# Patient Record
Sex: Male | Born: 1980 | Race: White | Hispanic: No | Marital: Married | Smoking: Former smoker
Health system: Southern US, Community
[De-identification: ages and names within clinical notes are randomized; demographics above are authoritative.]

## PROBLEM LIST (undated history)

## (undated) DIAGNOSIS — L299 Pruritus, unspecified: Secondary | ICD-10-CM

## (undated) DIAGNOSIS — N200 Calculus of kidney: Secondary | ICD-10-CM

## (undated) HISTORY — DX: Calculus of kidney: N20.0

## (undated) HISTORY — DX: Pruritus, unspecified: L29.9

---

## 2004-11-23 ENCOUNTER — Emergency Department (HOSPITAL_COMMUNITY): Admission: EM | Admit: 2004-11-23 | Discharge: 2004-11-23 | Payer: Self-pay | Admitting: Emergency Medicine

## 2005-12-13 ENCOUNTER — Ambulatory Visit (HOSPITAL_COMMUNITY): Admission: RE | Admit: 2005-12-13 | Discharge: 2005-12-13 | Payer: Self-pay | Admitting: Urology

## 2007-09-28 IMAGING — CT CT PELVIS WO/W CM
2 of 4 series · 12 of 32 positions shown, 18 images · IV contrast (omnipaque)
Comparison: None.

CLINICAL DATA: 25-year-old male with frequency of urination for two months.  
ABDOMEN CT WITHOUT AND WITH CONTRAST:
TECHNIQUE: Multidetector CT imaging of the abdomen was performed both before and during bolus administration of intravenous contrast.
Contrast:  733cc Omnipaque 300.
TECHNIQUE: Multidetector CT imaging of the pelvis was performed both before and during bolus administration of intravenous contrast.

[Series 1370: — · axial · 0.77mm/px · z∈[+1270,+1656]mm · 10 of 95 slices shown, 16 images (1 of 2)]
[im 9/95  soft-tissue]
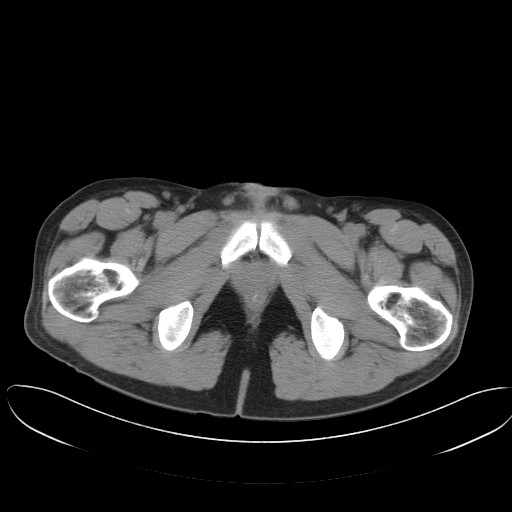
[im 9/95  bone]
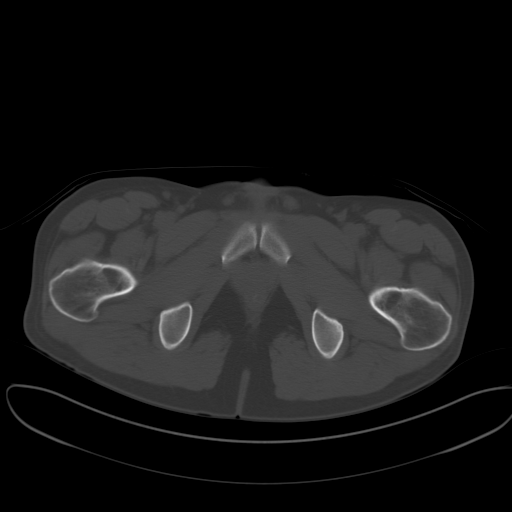
[im 18/95  soft-tissue]
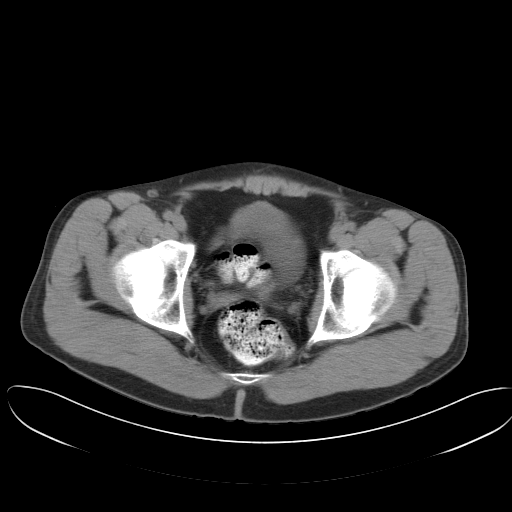
[im 26/95  soft-tissue]
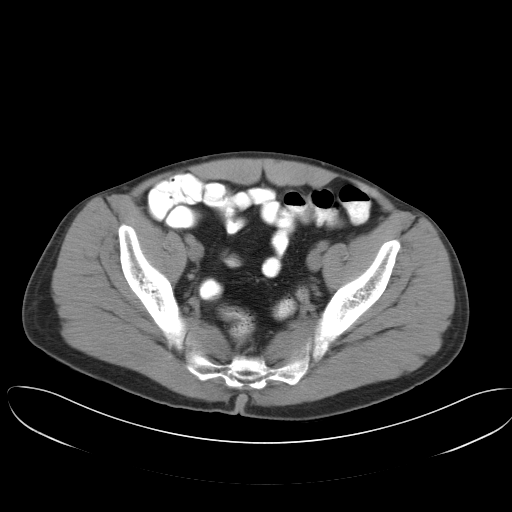
[im 35/95  soft-tissue]
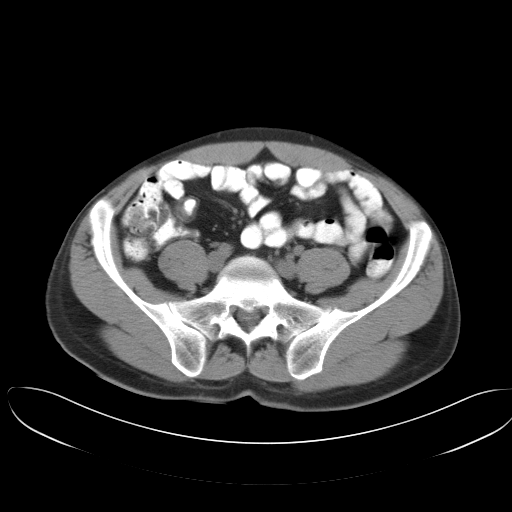
[im 43/95  soft-tissue]
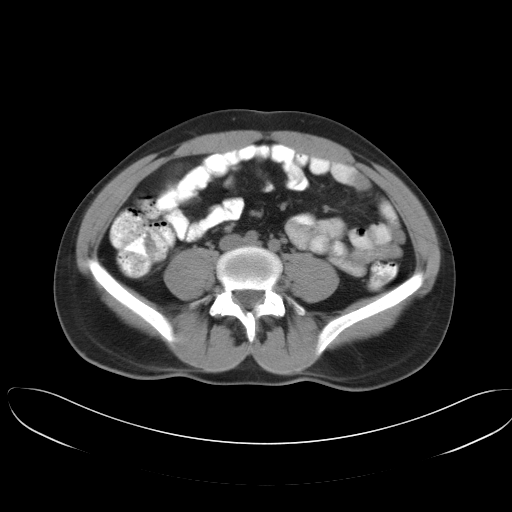
[im 52/95  soft-tissue]
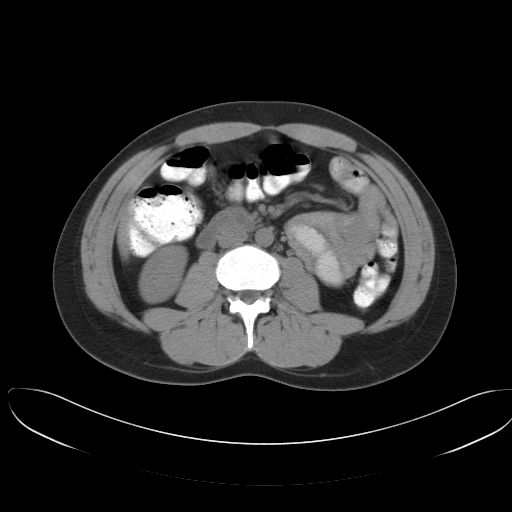
[im 60/95  soft-tissue]
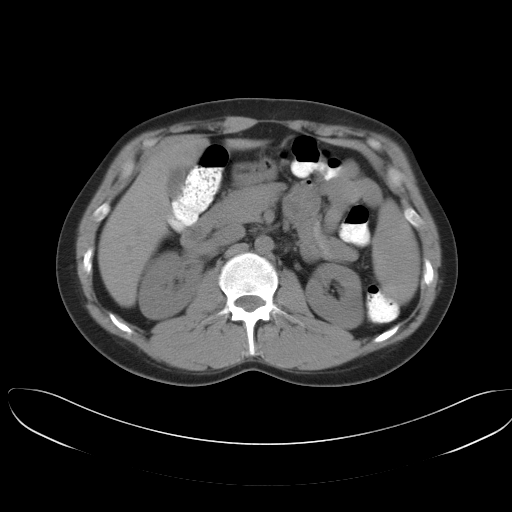
[im 60/95  lung]
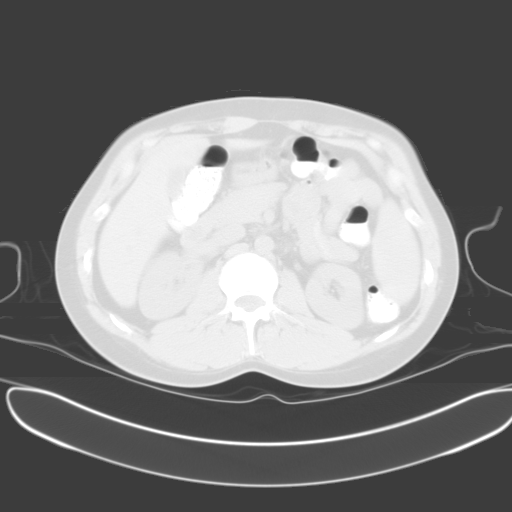
[im 69/95  soft-tissue]
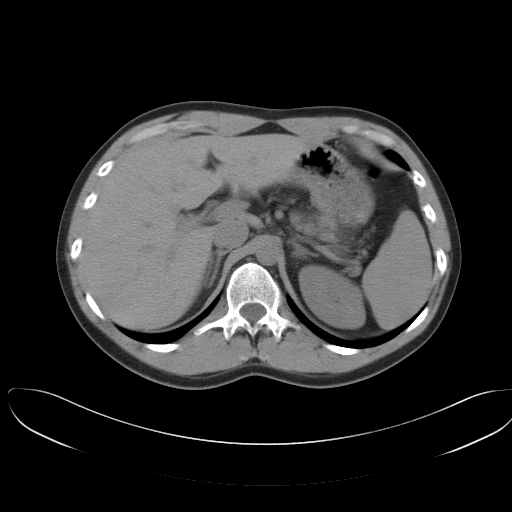
[im 69/95  lung]
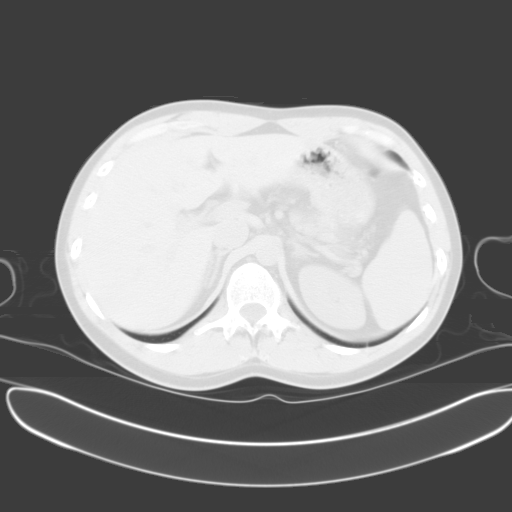
[im 77/95  soft-tissue]
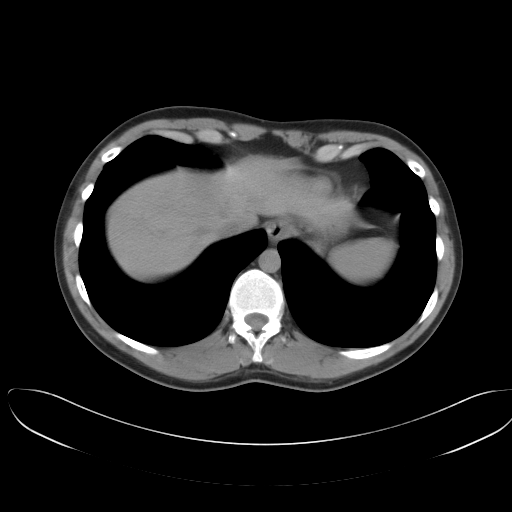
[im 77/95  lung]
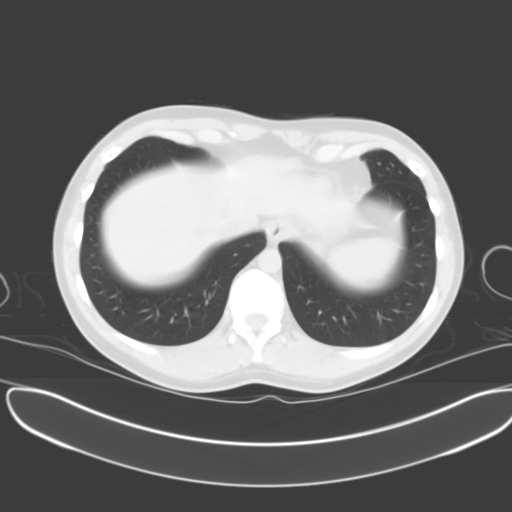
[im 77/95  bone]
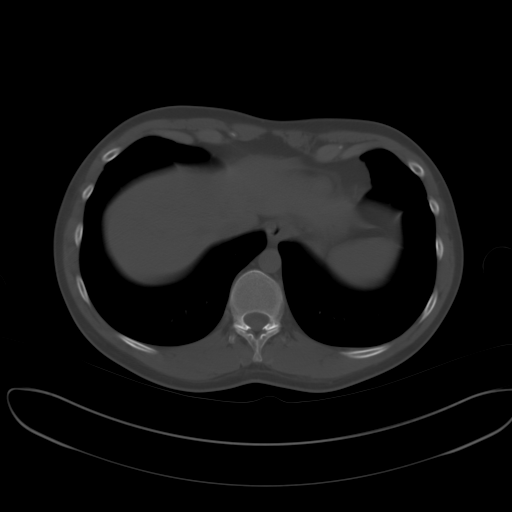
[im 86/95  soft-tissue]
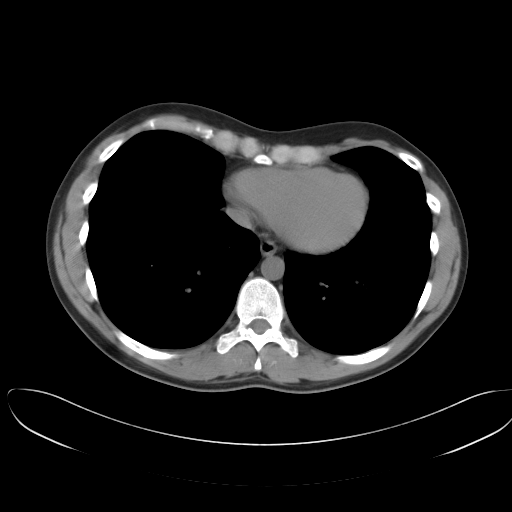
[im 86/95  lung]
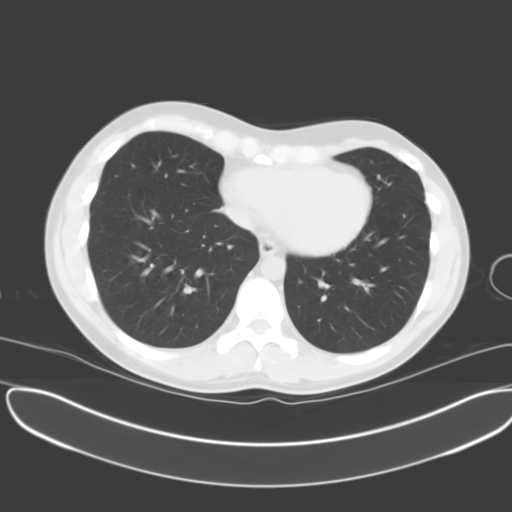

[Series 1371: — · axial · 0.77mm/px · z∈[+1586,+1640]mm · 2 of 35 slices shown (2 of 2)]
[im 12/35  soft-tissue]
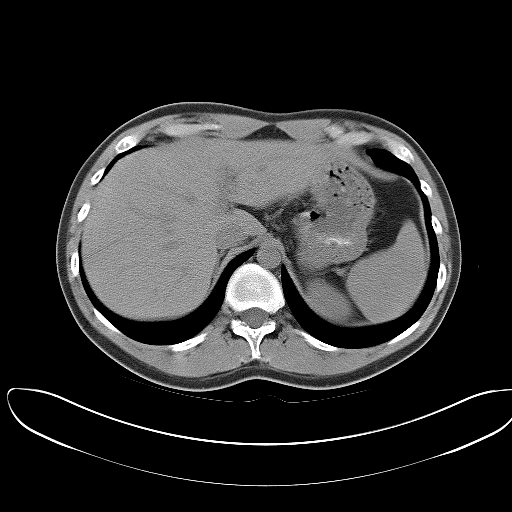
[im 23/35  soft-tissue]
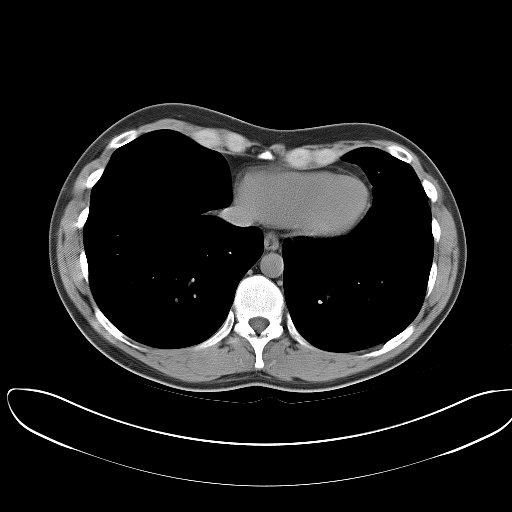

[12 of 32 positions shown; findings below may reference images not displayed]

FINDINGS: The lung bases are clear.  No evidence for free air.  Negative for kidney stones or hydronephrosis.  Mild wall thickening involving the terminal ileum.  This is a nonspecific finding and there is no significant inflammation in the adjacent fat.  The appendix has a normal appearance.  The liver, portal vein and gallbladder are within normal limits.  Normal appearance of both adrenal glands and kidneys.  The spleen, pancreas and stomach are within normal limits.  Negative for free fluid or lymphadenopathy.  A few prominent nodes in the ileocecal mesentery are nonspecific.  No acute bone abnormalities.
IMPRESSION: 1. No acute findings in the abdomen. 
2. Mild wall thickening involving the terminal ileum.  This is a nonspecific finding and could be related to under-distention.  

PELVIS CT WITH AND WITHOUT CONTRAST:
FINDINGS: Prostate, seminal vesicles and urinary bladder are within normal limits.  Moderate amount of stool in the colon.  No acute bone abnormalities.
IMPRESSION: Negative pelvic CT.

## 2008-04-05 ENCOUNTER — Ambulatory Visit (HOSPITAL_COMMUNITY): Admission: RE | Admit: 2008-04-05 | Discharge: 2008-04-05 | Payer: Self-pay | Admitting: Family Medicine

## 2008-04-19 ENCOUNTER — Inpatient Hospital Stay (HOSPITAL_COMMUNITY): Admission: EM | Admit: 2008-04-19 | Discharge: 2008-04-21 | Payer: Self-pay | Admitting: Emergency Medicine

## 2009-08-16 ENCOUNTER — Ambulatory Visit (HOSPITAL_COMMUNITY): Admission: RE | Admit: 2009-08-16 | Discharge: 2009-08-16 | Payer: Self-pay | Admitting: Family Medicine

## 2010-07-31 LAB — CBC
Hemoglobin: 12.5 g/dL — ABNORMAL LOW (ref 13.0–17.0)
Hemoglobin: 14.7 g/dL (ref 13.0–17.0)
MCHC: 33.9 g/dL (ref 30.0–36.0)
MCHC: 34 g/dL (ref 30.0–36.0)
MCV: 85.8 fL (ref 78.0–100.0)
MCV: 86.7 fL (ref 78.0–100.0)
RDW: 12.3 % (ref 11.5–15.5)
WBC: 18.9 10*3/uL — ABNORMAL HIGH (ref 4.0–10.5)

## 2010-07-31 LAB — DIFFERENTIAL
Basophils Absolute: 0 10*3/uL (ref 0.0–0.1)
Basophils Relative: 0 % (ref 0–1)
Eosinophils Absolute: 0 10*3/uL (ref 0.0–0.7)
Eosinophils Relative: 0 % (ref 0–5)
Monocytes Relative: 5 % (ref 3–12)
Neutro Abs: 17.1 10*3/uL — ABNORMAL HIGH (ref 1.7–7.7)
Neutrophils Relative %: 89 % — ABNORMAL HIGH (ref 43–77)
Neutrophils Relative %: 90 % — ABNORMAL HIGH (ref 43–77)

## 2010-07-31 LAB — CULTURE, ROUTINE-ABSCESS

## 2010-07-31 LAB — BASIC METABOLIC PANEL
GFR calc Af Amer: >60 mL/min (ref >60)
GFR calc non Af Amer: >60 mL/min (ref >60)
Glucose, Bld: 110 mg/dL — ABNORMAL HIGH (ref 70–99)
Potassium: 3.4 mEq/L — ABNORMAL LOW (ref 3.5–5.1)
Sodium: 136 mEq/L (ref 135–145)

## 2010-07-31 LAB — ANAEROBIC CULTURE

## 2010-08-29 NOTE — Discharge Summary (Signed)
NAME:  Dennis Weber, Dennis Weber NO.:  [id number]   MEDICAL RECORD NO.:  [id number]          PATIENT TYPE:  INP   LOCATION:  A227                          FACILITY:  [facility]   PHYSICIAN:  Dalia Heading, M.D.  DATE OF BIRTH:  [birth year]   DATE OF ADMISSION:  04/19/2008  DATE OF DISCHARGE:  01/06/2010LH                               DISCHARGE SUMMARY   HOSPITAL COURSE SUMMARY:  The patient is a 30 year old white male who  suffers from Crohn disease who presented to the emergency room with a  perirectal abscess.  He was admitted to the hospital for intravenous  antibiotic therapy and pain control.  Subsequently, he was taken to the  operating room on April 20, 2008, underwent incision and drainage of  the perirectal abscess.  He tolerated the procedure well.  His  postoperative course was unremarkable.  His diet was advanced without  difficulty.   The patient is being discharged home on April 21, 2008, in good  improving condition.   DISCHARGE INSTRUCTIONS:  The patient is to follow up with Dr. Franky Macho on April 27, 2008.  He is to do sitz baths twice a day.   DISCHARGE MEDICATIONS:  Asacol 400 mg p.o. 6 times a day, Prilosec 20 mg  p.o. daily, ciprofloxacin 500 mg p.o. b.i.d. x10 days, Flagyl 500 mg  p.o. t.i.d. x10 days, Percocet 1-2 tablets p.o. q.4 h p.r.n. pain.   PRINCIPAL DIAGNOSES:  1. Perirectal abscess.  2. History of Crohn disease.   PRINCIPAL PROCEDURE:  Incision and drainage of perirectal abscess on  April 20, 2008.      Dalia Heading, M.D.  Electronically Signed     MAJ/MEDQ  D:  04/21/2008  T:  04/21/2008  Job:  [id number]   cc:   Patrica Duel, M.D.  Fax: [phone]

## 2010-08-29 NOTE — H&P (Signed)
NAME:  Dennis Weber, Dennis Weber NO.:  [id number]   MEDICAL RECORD NO.:  [id number]          PATIENT TYPE:  EMS   LOCATION:  ED                            FACILITY:  [facility]   PHYSICIAN:  Dalia Heading, M.D.  DATE OF BIRTH:  06/25/1980   DATE OF ADMISSION:  04/19/2008  DATE OF DISCHARGE:  LH                              HISTORY & PHYSICAL   CHIEF COMPLAINT:  Perirectal abscess.   HISTORY OF PRESENT ILLNESS:  The patient is a 30 year old white male who  presents with a 1-week history of worsening perirectal pain.  He started  having fevers and chills over the last 24 hours.  He presented to the  emergency room for further evaluation and treatment.  He was noted to  have a perirectal abscess and a surgery consultation was obtained.   PAST MEDICAL HISTORY:  Crohn's disease.   PAST SURGICAL HISTORY:  Unremarkable.   CURRENT MEDICATIONS:  Asacol, ibuprofen.   ALLERGIES:  NO KNOWN DRUG ALLERGIES.   REVIEW OF SYSTEMS:  Noncontributory.   PHYSICAL EXAMINATION:  GENERAL:  The patient is a well-developed, well-  nourished white male in no acute distress.  VITAL SIGNS:  He is afebrile and vital signs are stable.  LUNGS:  Clear to auscultation with equal breath sounds bilaterally.  HEART:  Reveals a regular rate and rhythm without S3, S4 or murmurs.  ABDOMEN:  Benign.  RECTAL:  Reveals a perirectal abscess which is not draining along the  posterior aspect of the anus.  Examination was limited secondary to  pain.   MET-7 and CBC are pending.   IMPRESSION:  1. Perirectal abscess.  2. History of Crohn's disease.   PLAN:  The patient will be admitted to hospital for pain control and IV  antibiotics.  He subsequently will undergo incision and drainage of a  perirectal abscess in the operating room.  The risks and benefits of the  procedure, including bleeding, infection, and recurrence of the abscess  were fully explained to the patient who gave informed consent.      Dalia Heading, M.D.  Electronically Signed     MAJ/MEDQ  D:  04/19/2008  T:  04/19/2008  Job:  [id number]   cc:   Patrica Duel, M.D.  Fax: [phone]

## 2010-08-29 NOTE — Op Note (Signed)
NAME:  Dennis Weber, Dennis Weber NO.:  [id number]   MEDICAL RECORD NO.:  [id number]          PATIENT TYPE:  INP   LOCATION:  A227                          FACILITY:  [facility]   PHYSICIAN:  Dalia Heading, M.D.  DATE OF BIRTH:  Apr 17, 1980   DATE OF PROCEDURE:  04/20/2008  DATE OF DISCHARGE:                               OPERATIVE REPORT   PREOPERATIVE DIAGNOSIS:  Perirectal abscess.   POSTOPERATIVE DIAGNOSIS:  Perirectal abscess.   PROCEDURE:  Incision and drainage of perirectal abscess.   SURGEON:  Dalia Heading, MD   ANESTHESIA:  General.   INDICATIONS:  The patient is a 30 year old white male who presents with  a perirectal abscess.  The risks and benefits of the procedure including  bleeding, infection, and the possible recurrence of the abscess were  fully explained to the patient, gave informed consent.   PROCEDURE NOTE:  The patient was placed in the lithotomy position after  general anesthesia was administered.  The perineum was prepped and  draped using the usual sterile technique with Betadine.  Surgical site  confirmation was performed.   On rectal examination, the abscess cavity was noted at the 5 o'clock  position.  An incision was made just outside the anal verge.  Purulent  fluid was found.  This did track up submucosally along the posterior  aspect of the anus.  The wound was irrigated with normal saline.  The  fluid was cultured for both aerobic and anaerobic bacteria.  This was  sent to Microbiology for further examination.  Once irrigation was  performed, the wound was packed with iodoform new gauze.  Sensorcaine  0.5% was instilled in the surrounding wound.   All tape and needle counts were correct at the end of the procedure.  The patient was awakened and transferred to PACU in stable condition.   COMPLICATIONS:  None.   SPECIMEN:  Aerobic and anaerobic cultures, perirectal abscess.   BLOOD LOSS:  Minimal.      Dalia Heading, M.D.  Electronically Signed     MAJ/MEDQ  D:  04/20/2008  T:  04/21/2008  Job:  [id number]   cc:   Patrica Duel, M.D.  Fax: [phone]

## 2011-06-01 IMAGING — CR DG CHEST 2V
2 series · 2 of 2 positions shown · non-contrast
Comparison: PA and lateral chest 04/05/2008.

CLINICAL DATA: Cough.

CHEST - 2 VIEW

[view not recorded (1 of 2)]
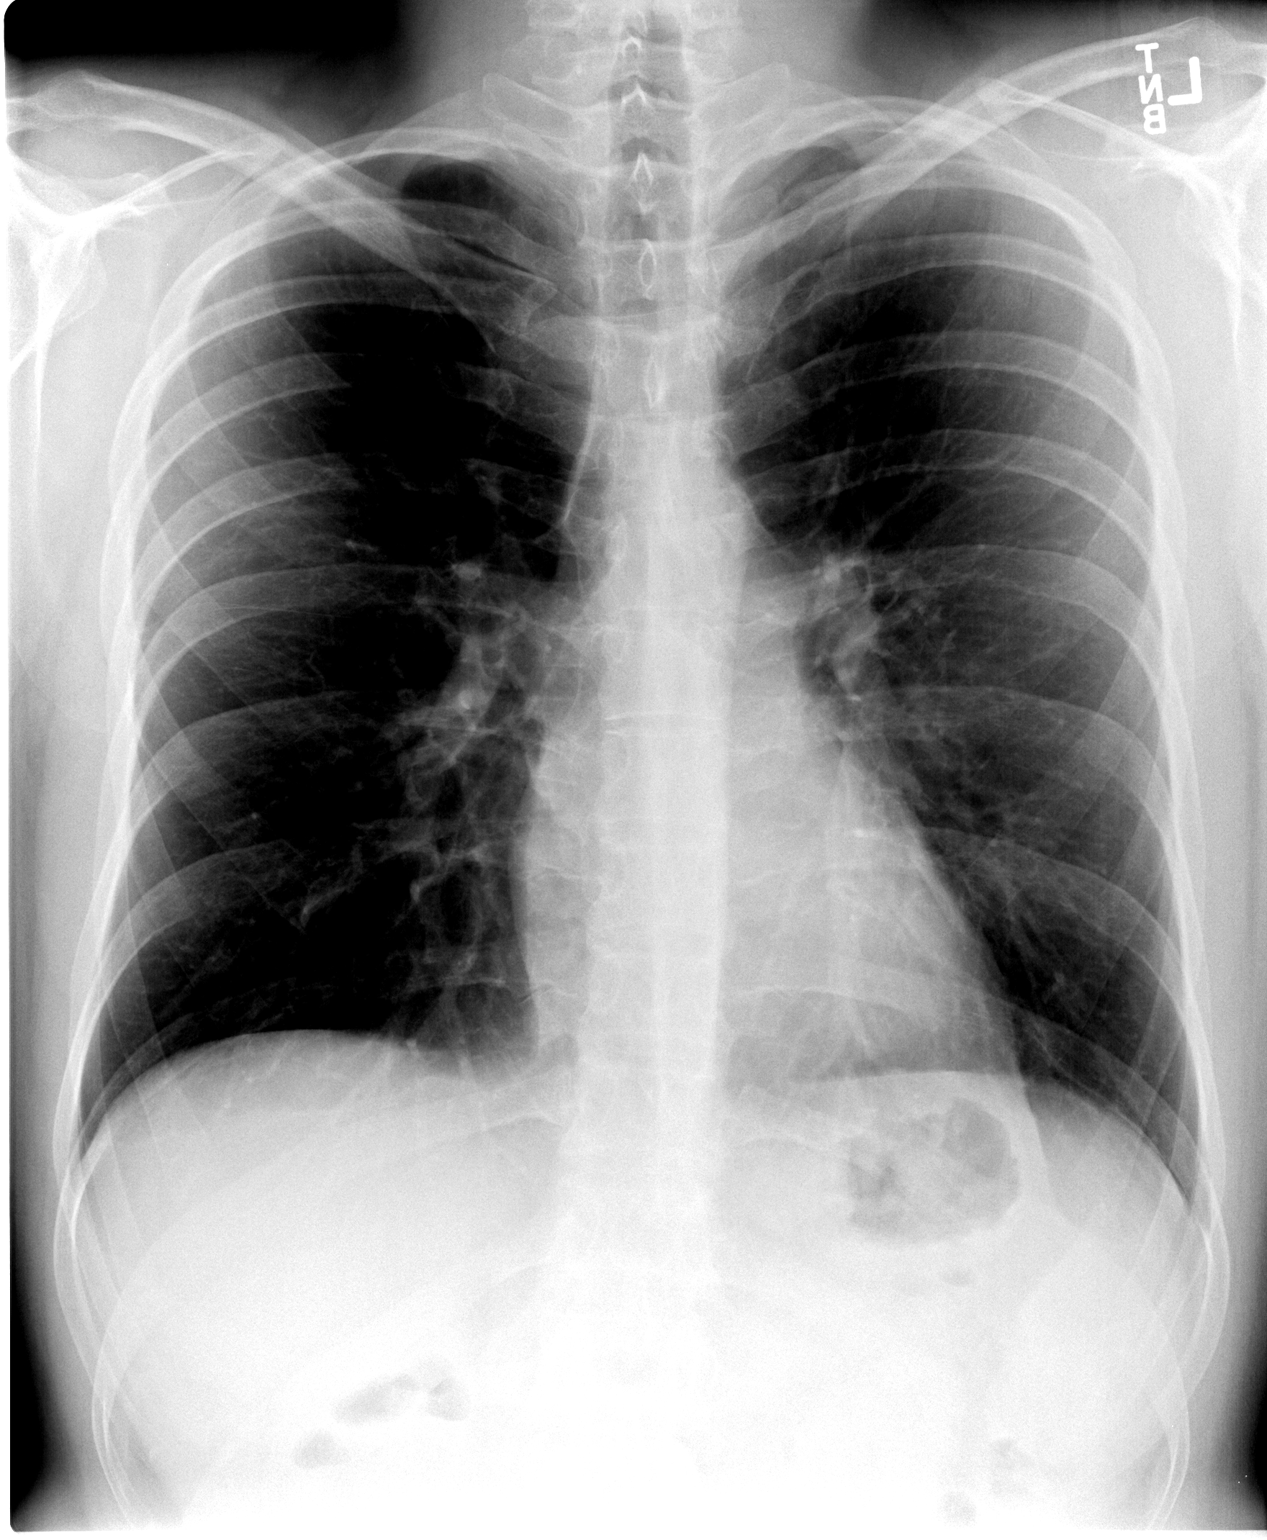

[view not recorded (2 of 2)]
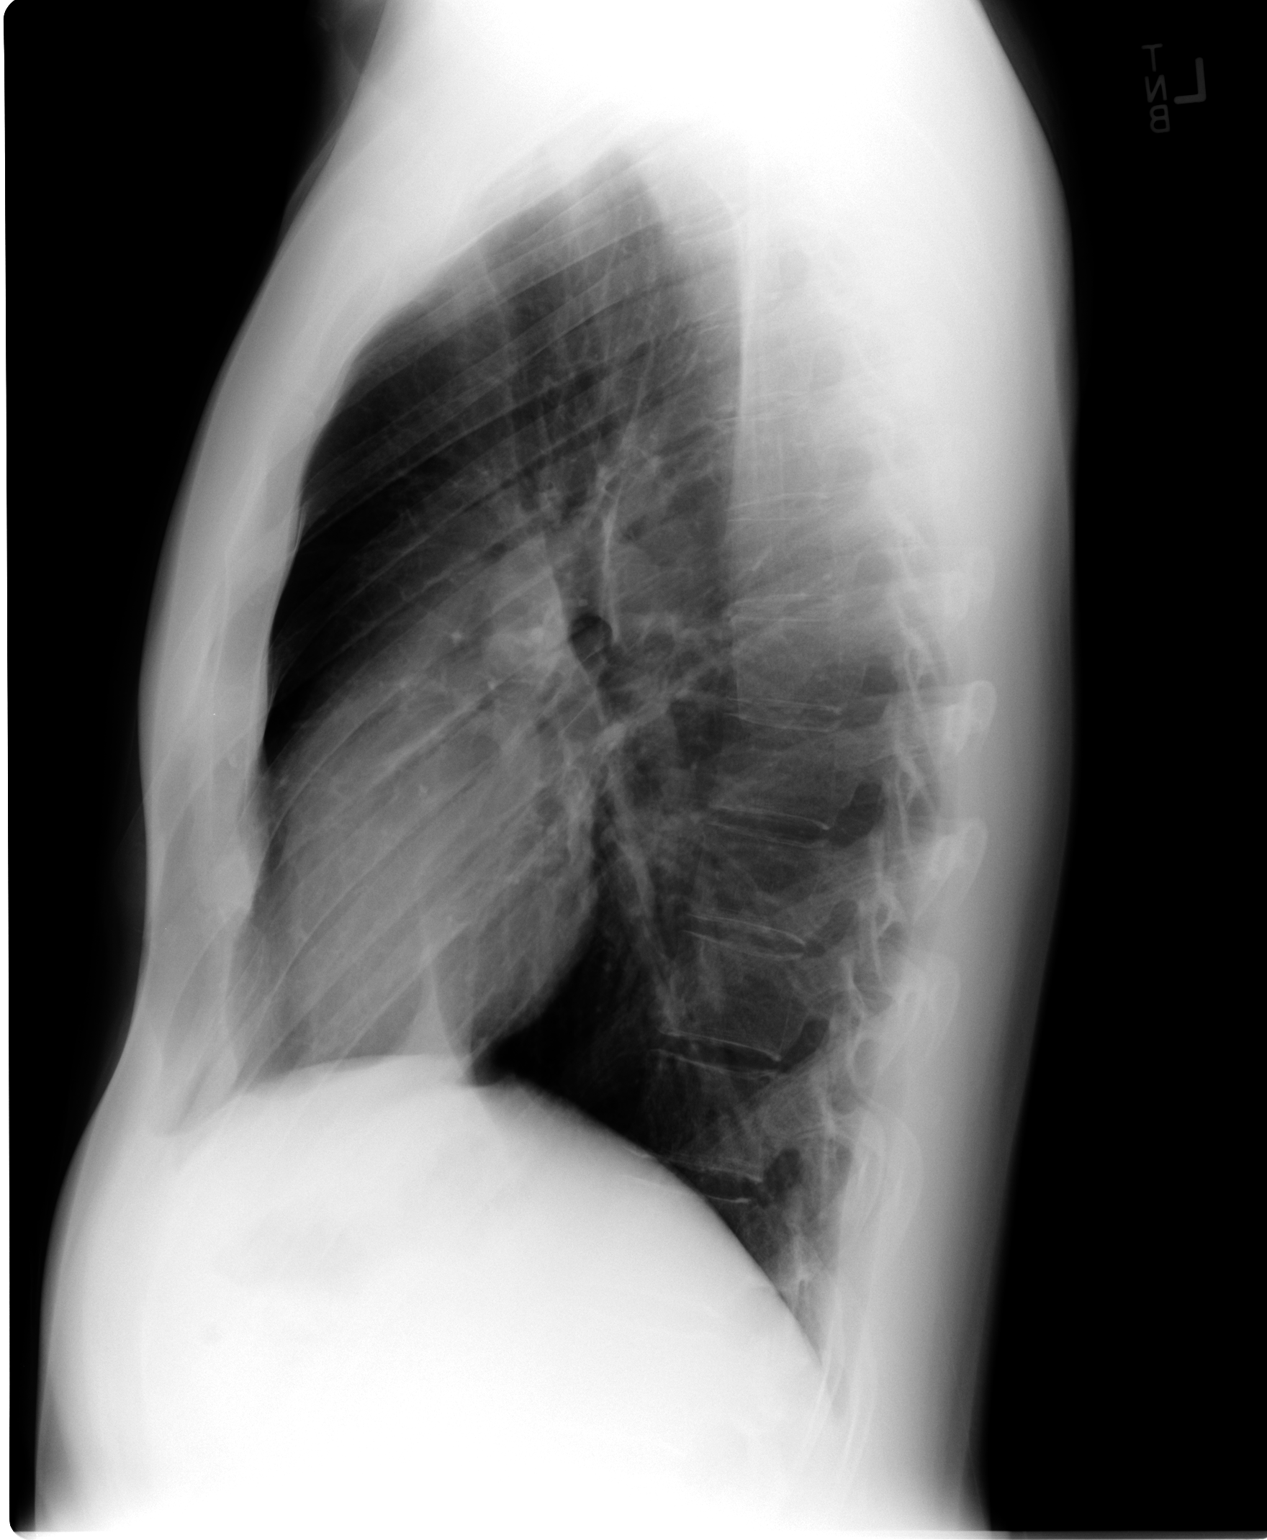

[2 of 2 positions shown; findings below may reference images not displayed]

FINDINGS: Lungs are clear.  Heart size is normal.  No pleural
effusion or focal bony abnormality.
IMPRESSION: No acute disease.

## 2018-11-05 ENCOUNTER — Ambulatory Visit: Payer: Self-pay | Admitting: Allergy and Immunology

## 2019-09-15 ENCOUNTER — Ambulatory Visit: Payer: Self-pay | Admitting: Allergy

## 2019-10-05 ENCOUNTER — Ambulatory Visit (INDEPENDENT_AMBULATORY_CARE_PROVIDER_SITE_OTHER): Payer: BC Managed Care – PPO | Admitting: Allergy and Immunology

## 2019-10-05 ENCOUNTER — Other Ambulatory Visit: Payer: Self-pay

## 2019-10-05 ENCOUNTER — Encounter: Payer: Self-pay | Admitting: Allergy and Immunology

## 2019-10-05 VITALS — BP 114/74 | HR 72 | Resp 22 | Ht 71.4 in | Wt 185.8 lb

## 2019-10-05 DIAGNOSIS — L299 Pruritus, unspecified: Secondary | ICD-10-CM | POA: Diagnosis not present

## 2019-10-05 NOTE — Patient Instructions (Signed)
  1.  Blood - CBC w/D, CMP, Ferritin, TSH, FT4, TP, Tryptase, area 2 aeroallergen profile  2. Cetirizine 10 mg - 1-2 tablets 1-2 times per day (Max=40 mg)  3. Can add benadryl if needed.  4. Further evaluation?

## 2019-10-05 NOTE — Progress Notes (Signed)
[place] [facility] [place] - [place] - [place] - [place]   Dear Dr. Venetia Maxon,  Thank you for referring Dennis Weber to the [facility] on 10/05/2019.   Below is a summation of this patient's evaluation and recommendations.  Thank you for your referral. I will keep you informed about this patient's response to treatment.   If you have any questions please do not hesitate to contact me.   Sincerely,  Jiles Prows, MD Allergy / Immunology [place]     ______________________________________________________________________    NEW PATIENT NOTE  Referring Provider: Street, Sharon Mt, * Primary Provider: Street, Sharon Mt, MD Date of office visit: 10/05/2019    Subjective:   Chief Complaint:  Dennis Weber (DOB: [birth year]) is a 39 y.o. male who presents to the clinic on 10/05/2019 with a chief complaint of Pruritis .     HPI: Dennis Weber presents to this clinic in evaluation of a pruritic disorder.  Apparently for the past 6 years he has developed a very severe pruritic disorder that disturbs his sleep and his quality of life starting late May through early September.  It involves predominantly his legs and arms.  He has no associated systemic or constitutional symptoms.  He has not been able to identify a etiologic factor responsible for this issue.  He has tried multiple food elimination diets, multiple topical agents, multiple antihistamines, and has received injections of systemic steroids without any effect.  His most recent injection of a systemic steroid was June 2021.  Heat exposure does not appear to precipitate this issue.  Past Medical History:  Diagnosis Date  . Itching   . Kidney stone     History reviewed. No pertinent surgical history.  Allergies as of 10/05/2019   No Known Allergies     Medication List      BEN GAY EX Apply topically.   cetirizine 10  MG tablet Commonly known as: ZYRTEC Take 10 mg by mouth daily.   chlorpheniramine 4 MG tablet Commonly known as: CHLOR-TRIMETON Take 4 mg by mouth every 6 (six) hours as needed.   fexofenadine 180 MG tablet Commonly known as: ALLEGRA Take 180 mg by mouth daily.   loratadine 10 MG tablet Commonly known as: CLARITIN Take 10 mg by mouth daily.       Review of systems negative except as noted in HPI / PMHx or noted below:  Review of Systems  Constitutional: Negative.   HENT: Negative.   Eyes: Negative.   Respiratory: Negative.   Cardiovascular: Negative.   Gastrointestinal: Negative.   Genitourinary: Negative.   Musculoskeletal: Negative.   Skin: Negative.   Neurological: Negative.   Endo/Heme/Allergies: Negative.   Psychiatric/Behavioral: Negative.     Family History  Problem Relation Age of Onset  . ALS Father   . Asthma Brother     Social History   Socioeconomic History  . Marital status: Married    Spouse name: Not on file  . Number of children: Not on file  . Years of education: Not on file  . Highest education level: Not on file  Occupational History  . Not on file  Tobacco Use  . Smoking status: Former Smoker    Years: 3.00    Types: Cigarettes    Quit date: 2001    Years since quitting: 20.4  . Smokeless tobacco: Never Used  Substance and Sexual Activity  . Alcohol use: Not  Currently  . Drug use: Never  . Sexual activity: Not on file  Other Topics Concern  . Not on file  Social History Narrative  . Not on file    Environmental and Social history  Lives in a house with a dry environment, cats and dogs located inside the household, carpet in the bedroom, no plastic on the bed, no plastic on the pillow, no smoking ongoing with inside the household.  He is a Librarian, academic.  Objective:   Vitals:   10/05/19 0945  BP: 114/74  Pulse: 72  Resp: (!) 22  SpO2: 97%   Height: 5' 11.4" (181.4 cm) Weight: 185 lb 12.8 oz (84.3  kg)  Physical Exam Constitutional:      Appearance: He is not diaphoretic.  HENT:     Head: Normocephalic.     Right Ear: Tympanic membrane, ear canal and external ear normal.     Left Ear: Tympanic membrane, ear canal and external ear normal.     Nose: Nose normal. No mucosal edema or rhinorrhea.     Mouth/Throat:     Pharynx: Uvula midline. No oropharyngeal exudate.  Eyes:     Conjunctiva/sclera: Conjunctivae normal.  Neck:     Thyroid: No thyromegaly.     Trachea: Trachea normal. No tracheal tenderness or tracheal deviation.  Cardiovascular:     Rate and Rhythm: Normal rate and regular rhythm.     Heart sounds: Normal heart sounds, S1 normal and S2 normal. No murmur heard.   Pulmonary:     Effort: No respiratory distress.     Breath sounds: Normal breath sounds. No stridor. No wheezing or rales.  Lymphadenopathy:     Head:     Right side of head: No tonsillar adenopathy.     Left side of head: No tonsillar adenopathy.     Cervical: No cervical adenopathy.  Skin:    Findings: No erythema or rash.     Nails: There is no clubbing.  Neurological:     Mental Status: He is alert.     Diagnostics: Allergy skin tests were not performed secondary to the recent use of an antihistamine.   Assessment and Plan:    1. Pruritic disorder     1.  Blood - CBC w/D, CMP, Ferritin, TSH, FT4, TP, Tryptase, area 2 aeroallergen profile  2. Cetirizine 10 mg - 1-2 tablets 1-2 times per day (Max=40 mg)  3. Can add benadryl if needed.  4. Further evaluation?  Dennis Weber appears to have a pruritic disorder that presents itself from the spring through fall season without an obvious etiologic factor.  We will investigate if this pruritic disorder is predominantly an atopic disorder and also rule out and screen for other etiologic factors responsible for this pruritic disorder including possible hemochromatosis, hypothyroidism, mast cell overgrowth, hematologic disorder, and hepatitis.  I will  contact him with the results of his blood test once they are available for review.  Jessica Priest, MD Allergy / Immunology [facility]

## 2019-10-06 ENCOUNTER — Encounter: Payer: Self-pay | Admitting: Allergy and Immunology

## 2019-10-10 LAB — CBC WITH DIFFERENTIAL/PLATELET
Basophils Absolute: 0 10*3/uL (ref 0.0–0.2)
Basos: 1 %
EOS (ABSOLUTE): 0.1 10*3/uL (ref 0.0–0.4)
Eos: 2 %
Hematocrit: 49.5 % (ref 37.5–51.0)
Hemoglobin: 16.5 g/dL (ref 13.0–17.7)
Immature Grans (Abs): 0.2 10*3/uL — ABNORMAL HIGH (ref 0.0–0.1)
Immature Granulocytes: 2 %
Lymphocytes Absolute: 1.5 10*3/uL (ref 0.7–3.1)
Lymphs: 19 %
MCH: 28.4 pg (ref 26.6–33.0)
MCHC: 33.3 g/dL (ref 31.5–35.7)
MCV: 85 fL (ref 79–97)
Monocytes Absolute: 0.5 10*3/uL (ref 0.1–0.9)
Monocytes: 6 %
Neutrophils Absolute: 5.5 10*3/uL (ref 1.4–7.0)
Neutrophils: 70 %
RBC: 5.8 x10E6/uL (ref 4.14–5.80)
RDW: 12.5 % (ref 11.6–15.4)
WBC: 7.8 10*3/uL (ref 3.4–10.8)

## 2019-10-10 LAB — ALLERGENS W/TOTAL IGE AREA 2

## 2019-10-10 LAB — COMPREHENSIVE METABOLIC PANEL
ALT: 17 IU/L (ref 0–44)
AST: 18 IU/L (ref 0–40)
Albumin/Globulin Ratio: 2.1 (ref 1.2–2.2)
Albumin: 4.7 g/dL (ref 4.0–5.0)
Alkaline Phosphatase: 104 IU/L (ref 48–121)
BUN/Creatinine Ratio: 10 (ref 9–20)
BUN: 11 mg/dL (ref 6–20)
Bilirubin Total: 0.5 mg/dL (ref 0.0–1.2)
CO2: 25 mmol/L (ref 20–29)
Calcium: 9.8 mg/dL (ref 8.7–10.2)
Chloride: 100 mmol/L (ref 96–106)
Creatinine, Ser: 1.09 mg/dL (ref 0.76–1.27)
GFR calc Af Amer: 99 mL/min/{1.73_m2} (ref >59)
GFR calc non Af Amer: 86 mL/min/{1.73_m2} (ref >59)
Globulin, Total: 2.2 g/dL (ref 1.5–4.5)
Glucose: 93 mg/dL (ref 65–99)
Potassium: 4.2 mmol/L (ref 3.5–5.2)
Sodium: 141 mmol/L (ref 134–144)
Total Protein: 6.9 g/dL (ref 6.0–8.5)

## 2019-10-10 LAB — TRYPTASE: Tryptase: 3.5 ug/L (ref 2.2–13.2)

## 2019-10-10 LAB — TSH+FREE T4
Free T4: 1.63 ng/dL (ref 0.82–1.77)
TSH: 0.771 u[IU]/mL (ref 0.450–4.500)

## 2019-10-10 LAB — FERRITIN: Ferritin: 75 ng/mL (ref 30–400)

## 2019-10-10 LAB — THYROID PEROXIDASE ANTIBODY: Thyroperoxidase Ab SerPl-aCnc: 10 IU/mL (ref 0–34)
